# Patient Record
Sex: Female | Born: 1950 | Race: Black or African American | Hispanic: No | Marital: Married | Smoking: Never smoker
Health system: Southern US, Community
[De-identification: ages and names within clinical notes are randomized; demographics above are authoritative.]

## PROBLEM LIST (undated history)

## (undated) DIAGNOSIS — R51 Headache: Secondary | ICD-10-CM

## (undated) DIAGNOSIS — M1990 Unspecified osteoarthritis, unspecified site: Secondary | ICD-10-CM

## (undated) DIAGNOSIS — K5730 Diverticulosis of large intestine without perforation or abscess without bleeding: Secondary | ICD-10-CM

## (undated) DIAGNOSIS — G4730 Sleep apnea, unspecified: Secondary | ICD-10-CM

## (undated) DIAGNOSIS — I10 Essential (primary) hypertension: Secondary | ICD-10-CM

## (undated) DIAGNOSIS — C539 Malignant neoplasm of cervix uteri, unspecified: Secondary | ICD-10-CM

## (undated) HISTORY — DX: Sleep apnea, unspecified: G47.30

## (undated) HISTORY — PX: ABDOMINAL HYSTERECTOMY: SHX81

## (undated) HISTORY — DX: Headache: R51

## (undated) HISTORY — DX: Diverticulosis of large intestine without perforation or abscess without bleeding: K57.30

## (undated) HISTORY — PX: OTHER SURGICAL HISTORY: SHX169

## (undated) HISTORY — PX: CHOLECYSTECTOMY: SHX55

## (undated) HISTORY — DX: Essential (primary) hypertension: I10

## (undated) HISTORY — DX: Unspecified osteoarthritis, unspecified site: M19.90

## (undated) HISTORY — PX: KNEE SURGERY: SHX244

---

## 1997-07-27 ENCOUNTER — Encounter: Payer: Self-pay | Admitting: Internal Medicine

## 1998-02-09 ENCOUNTER — Emergency Department (HOSPITAL_COMMUNITY): Admission: EM | Admit: 1998-02-09 | Discharge: 1998-02-09 | Payer: Self-pay | Admitting: Emergency Medicine

## 1998-09-01 ENCOUNTER — Ambulatory Visit (HOSPITAL_COMMUNITY): Admission: RE | Admit: 1998-09-01 | Discharge: 1998-09-01 | Payer: Self-pay | Admitting: Internal Medicine

## 1998-09-01 ENCOUNTER — Encounter: Payer: Self-pay | Admitting: Internal Medicine

## 1998-10-11 ENCOUNTER — Other Ambulatory Visit: Admission: RE | Admit: 1998-10-11 | Discharge: 1998-10-11 | Payer: Self-pay | Admitting: Internal Medicine

## 2001-09-23 ENCOUNTER — Other Ambulatory Visit: Admission: RE | Admit: 2001-09-23 | Discharge: 2001-09-23 | Payer: Self-pay | Admitting: Internal Medicine

## 2002-09-09 ENCOUNTER — Encounter (HOSPITAL_BASED_OUTPATIENT_CLINIC_OR_DEPARTMENT_OTHER): Payer: Self-pay | Admitting: General Surgery

## 2002-09-13 ENCOUNTER — Encounter (INDEPENDENT_AMBULATORY_CARE_PROVIDER_SITE_OTHER): Payer: Self-pay | Admitting: *Deleted

## 2002-09-13 ENCOUNTER — Ambulatory Visit (HOSPITAL_COMMUNITY): Admission: RE | Admit: 2002-09-13 | Discharge: 2002-09-13 | Payer: Self-pay | Admitting: General Surgery

## 2002-12-31 ENCOUNTER — Other Ambulatory Visit: Admission: RE | Admit: 2002-12-31 | Discharge: 2002-12-31 | Payer: Self-pay | Admitting: Internal Medicine

## 2004-05-16 ENCOUNTER — Ambulatory Visit: Payer: Self-pay | Admitting: Internal Medicine

## 2004-07-02 ENCOUNTER — Ambulatory Visit: Payer: Self-pay | Admitting: Internal Medicine

## 2004-07-04 ENCOUNTER — Ambulatory Visit: Payer: Self-pay

## 2004-08-06 ENCOUNTER — Encounter: Admission: RE | Admit: 2004-08-06 | Discharge: 2004-08-06 | Payer: Self-pay | Admitting: Internal Medicine

## 2004-08-06 ENCOUNTER — Ambulatory Visit: Payer: Self-pay | Admitting: Internal Medicine

## 2004-08-14 ENCOUNTER — Encounter: Admission: RE | Admit: 2004-08-14 | Discharge: 2004-08-14 | Payer: Self-pay | Admitting: Internal Medicine

## 2004-08-30 ENCOUNTER — Ambulatory Visit: Payer: Self-pay | Admitting: Family Medicine

## 2004-09-07 ENCOUNTER — Encounter (INDEPENDENT_AMBULATORY_CARE_PROVIDER_SITE_OTHER): Payer: Self-pay | Admitting: Specialist

## 2004-09-07 ENCOUNTER — Observation Stay (HOSPITAL_COMMUNITY): Admission: RE | Admit: 2004-09-07 | Discharge: 2004-09-08 | Payer: Self-pay | Admitting: General Surgery

## 2004-09-12 ENCOUNTER — Ambulatory Visit: Payer: Self-pay | Admitting: Internal Medicine

## 2005-02-15 ENCOUNTER — Ambulatory Visit: Payer: Self-pay | Admitting: Internal Medicine

## 2005-02-21 ENCOUNTER — Encounter: Admission: RE | Admit: 2005-02-21 | Discharge: 2005-02-21 | Payer: Self-pay | Admitting: Internal Medicine

## 2005-03-14 ENCOUNTER — Ambulatory Visit: Payer: Self-pay | Admitting: Internal Medicine

## 2005-05-06 ENCOUNTER — Ambulatory Visit (HOSPITAL_COMMUNITY): Admission: RE | Admit: 2005-05-06 | Discharge: 2005-05-06 | Payer: Self-pay | Admitting: *Deleted

## 2005-05-06 ENCOUNTER — Encounter (INDEPENDENT_AMBULATORY_CARE_PROVIDER_SITE_OTHER): Payer: Self-pay | Admitting: Specialist

## 2005-05-21 ENCOUNTER — Ambulatory Visit: Payer: Self-pay | Admitting: Internal Medicine

## 2005-07-29 ENCOUNTER — Ambulatory Visit: Payer: Self-pay | Admitting: Internal Medicine

## 2005-08-20 ENCOUNTER — Ambulatory Visit: Payer: Self-pay | Admitting: Internal Medicine

## 2005-08-21 ENCOUNTER — Ambulatory Visit (HOSPITAL_BASED_OUTPATIENT_CLINIC_OR_DEPARTMENT_OTHER): Admission: RE | Admit: 2005-08-21 | Discharge: 2005-08-21 | Payer: Self-pay | Admitting: Internal Medicine

## 2005-08-21 ENCOUNTER — Encounter: Payer: Self-pay | Admitting: Internal Medicine

## 2005-08-25 ENCOUNTER — Ambulatory Visit: Payer: Self-pay | Admitting: Internal Medicine

## 2005-09-05 ENCOUNTER — Ambulatory Visit: Payer: Self-pay | Admitting: Internal Medicine

## 2005-09-10 ENCOUNTER — Ambulatory Visit: Payer: Self-pay | Admitting: Internal Medicine

## 2005-10-03 ENCOUNTER — Ambulatory Visit: Payer: Self-pay | Admitting: Internal Medicine

## 2005-11-14 ENCOUNTER — Ambulatory Visit: Payer: Self-pay | Admitting: Internal Medicine

## 2006-02-12 ENCOUNTER — Ambulatory Visit: Payer: Self-pay | Admitting: Internal Medicine

## 2007-05-05 ENCOUNTER — Ambulatory Visit: Payer: Self-pay | Admitting: Internal Medicine

## 2007-05-05 DIAGNOSIS — M479 Spondylosis, unspecified: Secondary | ICD-10-CM | POA: Insufficient documentation

## 2007-05-05 DIAGNOSIS — M1990 Unspecified osteoarthritis, unspecified site: Secondary | ICD-10-CM | POA: Insufficient documentation

## 2007-05-05 DIAGNOSIS — G4730 Sleep apnea, unspecified: Secondary | ICD-10-CM | POA: Insufficient documentation

## 2007-05-05 DIAGNOSIS — M129 Arthropathy, unspecified: Secondary | ICD-10-CM | POA: Insufficient documentation

## 2007-05-05 DIAGNOSIS — I10 Essential (primary) hypertension: Secondary | ICD-10-CM | POA: Insufficient documentation

## 2007-05-05 DIAGNOSIS — M25549 Pain in joints of unspecified hand: Secondary | ICD-10-CM

## 2007-05-05 DIAGNOSIS — G2581 Restless legs syndrome: Secondary | ICD-10-CM

## 2007-05-05 DIAGNOSIS — D259 Leiomyoma of uterus, unspecified: Secondary | ICD-10-CM | POA: Insufficient documentation

## 2007-05-28 ENCOUNTER — Ambulatory Visit: Payer: Self-pay | Admitting: Internal Medicine

## 2007-05-28 LAB — CONVERTED CEMR LAB
Alkaline Phosphatase: 61 units/L (ref 39–117)
BUN: 15 mg/dL (ref 6–23)
Basophils Absolute: 0 10*3/uL (ref 0.0–0.1)
CO2: 32 meq/L (ref 19–32)
CRP, High Sensitivity: 12 — ABNORMAL HIGH (ref 0.00–5.00)
Cholesterol: 232 mg/dL (ref 0–200)
Creatinine, Ser: 0.8 mg/dL (ref 0.4–1.2)
Direct LDL: 139 mg/dL
Eosinophils Absolute: 0.1 10*3/uL (ref 0.0–0.6)
GFR calc Af Amer: 95 mL/min
HCT: 38.3 % (ref 36.0–46.0)
HDL: 77 mg/dL (ref >39.0)
Hemoglobin: 13.3 g/dL (ref 12.0–15.0)
Lymphocytes Relative: 38.5 % (ref 12.0–46.0)
MCHC: 34.7 g/dL (ref 30.0–36.0)
MCV: 87.3 fL (ref 78.0–100.0)
Monocytes Absolute: 0.4 10*3/uL (ref 0.2–0.7)
Monocytes Relative: 6.6 % (ref 3.0–11.0)
Neutro Abs: 2.9 10*3/uL (ref 1.4–7.7)
Neutrophils Relative %: 53.6 % (ref 43.0–77.0)
Potassium: 4.2 meq/L (ref 3.5–5.1)
Rhuematoid fact SerPl-aCnc: <20 intl units/mL — ABNORMAL LOW (ref 0.0–20.0)
Sodium: 140 meq/L (ref 135–145)
TSH: 2.02 microintl units/mL (ref 0.35–5.50)
Total Bilirubin: 0.8 mg/dL (ref 0.3–1.2)
Total CHOL/HDL Ratio: 3
Triglycerides: 73 mg/dL (ref 0–149)
Uric Acid, Serum: 5.8 mg/dL (ref 2.4–7.0)
VLDL: 15 mg/dL (ref 0–40)

## 2007-05-31 LAB — CONVERTED CEMR LAB
Anti Nuclear Antibody(ANA): NEGATIVE
Vit D, 1,25-Dihydroxy: 10 — ABNORMAL LOW (ref 30–89)

## 2007-06-03 ENCOUNTER — Ambulatory Visit: Payer: Self-pay | Admitting: Internal Medicine

## 2007-06-03 DIAGNOSIS — E559 Vitamin D deficiency, unspecified: Secondary | ICD-10-CM | POA: Insufficient documentation

## 2007-06-22 ENCOUNTER — Encounter: Payer: Self-pay | Admitting: Internal Medicine

## 2007-07-21 ENCOUNTER — Ambulatory Visit: Payer: Self-pay | Admitting: Internal Medicine

## 2007-07-21 DIAGNOSIS — R05 Cough: Secondary | ICD-10-CM | POA: Insufficient documentation

## 2007-08-05 ENCOUNTER — Ambulatory Visit: Payer: Self-pay | Admitting: Internal Medicine

## 2007-08-05 LAB — CONVERTED CEMR LAB: CRP, High Sensitivity: 9 — ABNORMAL HIGH (ref 0.00–5.00)

## 2007-08-07 ENCOUNTER — Encounter: Payer: Self-pay | Admitting: Internal Medicine

## 2007-08-09 LAB — CONVERTED CEMR LAB: Vit D, 1,25-Dihydroxy: 31 (ref 30–89)

## 2007-08-14 ENCOUNTER — Ambulatory Visit: Payer: Self-pay | Admitting: Internal Medicine

## 2007-08-14 DIAGNOSIS — K5730 Diverticulosis of large intestine without perforation or abscess without bleeding: Secondary | ICD-10-CM | POA: Insufficient documentation

## 2007-08-18 ENCOUNTER — Encounter: Payer: Self-pay | Admitting: Internal Medicine

## 2007-09-11 ENCOUNTER — Ambulatory Visit: Payer: Self-pay | Admitting: Internal Medicine

## 2007-09-17 ENCOUNTER — Ambulatory Visit: Payer: Self-pay | Admitting: Family Medicine

## 2007-09-17 DIAGNOSIS — G43909 Migraine, unspecified, not intractable, without status migrainosus: Secondary | ICD-10-CM | POA: Insufficient documentation

## 2007-09-22 ENCOUNTER — Ambulatory Visit: Payer: Self-pay | Admitting: Internal Medicine

## 2007-09-22 ENCOUNTER — Telehealth (INDEPENDENT_AMBULATORY_CARE_PROVIDER_SITE_OTHER): Payer: Self-pay | Admitting: *Deleted

## 2007-09-22 DIAGNOSIS — R519 Headache, unspecified: Secondary | ICD-10-CM | POA: Insufficient documentation

## 2007-09-22 DIAGNOSIS — R51 Headache: Secondary | ICD-10-CM

## 2007-09-24 ENCOUNTER — Telehealth: Payer: Self-pay | Admitting: Family Medicine

## 2007-09-28 ENCOUNTER — Encounter: Admission: RE | Admit: 2007-09-28 | Discharge: 2007-09-28 | Payer: Self-pay | Admitting: Internal Medicine

## 2007-09-28 LAB — CONVERTED CEMR LAB: BUN: 12 mg/dL (ref 6–23)

## 2007-09-30 DIAGNOSIS — R9409 Abnormal results of other function studies of central nervous system: Secondary | ICD-10-CM

## 2007-10-06 ENCOUNTER — Ambulatory Visit: Payer: Self-pay | Admitting: Gastroenterology

## 2007-10-16 ENCOUNTER — Ambulatory Visit: Payer: Self-pay | Admitting: Internal Medicine

## 2007-10-16 DIAGNOSIS — L659 Nonscarring hair loss, unspecified: Secondary | ICD-10-CM | POA: Insufficient documentation

## 2007-10-16 DIAGNOSIS — R7309 Other abnormal glucose: Secondary | ICD-10-CM

## 2007-10-16 LAB — CONVERTED CEMR LAB: Blood Glucose, Fingerstick: 90

## 2007-10-20 ENCOUNTER — Encounter: Payer: Self-pay | Admitting: Internal Medicine

## 2007-10-20 ENCOUNTER — Ambulatory Visit: Payer: Self-pay | Admitting: Gastroenterology

## 2007-10-23 ENCOUNTER — Telehealth: Payer: Self-pay | Admitting: *Deleted

## 2007-10-29 ENCOUNTER — Telehealth: Payer: Self-pay | Admitting: Internal Medicine

## 2007-11-03 ENCOUNTER — Encounter: Payer: Self-pay | Admitting: Internal Medicine

## 2008-02-12 ENCOUNTER — Ambulatory Visit: Payer: Self-pay | Admitting: Internal Medicine

## 2008-02-12 DIAGNOSIS — M25519 Pain in unspecified shoulder: Secondary | ICD-10-CM | POA: Insufficient documentation

## 2008-02-12 LAB — CONVERTED CEMR LAB: Blood Glucose, Fingerstick: 105

## 2008-02-24 ENCOUNTER — Encounter: Payer: Self-pay | Admitting: Internal Medicine

## 2008-04-07 ENCOUNTER — Encounter: Payer: Self-pay | Admitting: Internal Medicine

## 2008-04-18 ENCOUNTER — Ambulatory Visit: Payer: Self-pay | Admitting: Internal Medicine

## 2008-04-18 DIAGNOSIS — N926 Irregular menstruation, unspecified: Secondary | ICD-10-CM | POA: Insufficient documentation

## 2008-04-18 DIAGNOSIS — D649 Anemia, unspecified: Secondary | ICD-10-CM | POA: Insufficient documentation

## 2008-04-18 DIAGNOSIS — N939 Abnormal uterine and vaginal bleeding, unspecified: Secondary | ICD-10-CM

## 2008-04-18 LAB — CONVERTED CEMR LAB: Hemoglobin: 11.6 g/dL

## 2008-07-13 ENCOUNTER — Ambulatory Visit: Payer: Self-pay | Admitting: Internal Medicine

## 2008-07-27 ENCOUNTER — Encounter: Payer: Self-pay | Admitting: Internal Medicine

## 2008-08-04 ENCOUNTER — Ambulatory Visit: Payer: Self-pay | Admitting: Internal Medicine

## 2008-08-04 LAB — CONVERTED CEMR LAB
ALT: 17 units/L (ref 0–35)
Alkaline Phosphatase: 61 units/L (ref 39–117)
Basophils Relative: 0.7 % (ref 0.0–3.0)
Bilirubin, Direct: 0.1 mg/dL (ref 0.0–0.3)
CO2: 29 meq/L (ref 19–32)
Eosinophils Relative: 0.6 % (ref 0.0–5.0)
Glucose, Bld: 100 mg/dL — ABNORMAL HIGH (ref 70–99)
HDL: 72.3 mg/dL (ref >39.0)
Hemoglobin: 12.7 g/dL (ref 12.0–15.0)
Lymphocytes Relative: 33.2 % (ref 12.0–46.0)
MCHC: 34.5 g/dL (ref 30.0–36.0)
Neutrophils Relative %: 59.4 % (ref 43.0–77.0)
Potassium: 3.2 meq/L — ABNORMAL LOW (ref 3.5–5.1)
RBC: 4.24 M/uL (ref 3.87–5.11)
Sodium: 140 meq/L (ref 135–145)
TSH: 1.31 microintl units/mL (ref 0.35–5.50)
Total CHOL/HDL Ratio: 3
Total Protein: 6.9 g/dL (ref 6.0–8.3)
VLDL: 18 mg/dL (ref 0–40)
WBC: 5.6 10*3/uL (ref 4.5–10.5)

## 2008-08-15 ENCOUNTER — Ambulatory Visit: Payer: Self-pay | Admitting: Internal Medicine

## 2008-08-19 ENCOUNTER — Ambulatory Visit: Payer: Self-pay | Admitting: Internal Medicine

## 2008-09-15 ENCOUNTER — Ambulatory Visit: Payer: Self-pay | Admitting: Internal Medicine

## 2008-09-15 LAB — CONVERTED CEMR LAB
CO2: 29 meq/L (ref 19–32)
Potassium: 3.9 meq/L (ref 3.5–5.1)
Sodium: 143 meq/L (ref 135–145)

## 2008-09-23 ENCOUNTER — Ambulatory Visit: Payer: Self-pay | Admitting: Internal Medicine

## 2008-09-23 DIAGNOSIS — N760 Acute vaginitis: Secondary | ICD-10-CM | POA: Insufficient documentation

## 2008-09-23 DIAGNOSIS — M25569 Pain in unspecified knee: Secondary | ICD-10-CM

## 2008-10-12 ENCOUNTER — Encounter: Payer: Self-pay | Admitting: Internal Medicine

## 2008-10-25 ENCOUNTER — Telehealth: Payer: Self-pay | Admitting: Internal Medicine

## 2008-10-28 ENCOUNTER — Encounter: Payer: Self-pay | Admitting: Internal Medicine

## 2008-11-09 ENCOUNTER — Telehealth: Payer: Self-pay | Admitting: *Deleted

## 2008-11-25 ENCOUNTER — Ambulatory Visit (HOSPITAL_COMMUNITY): Admission: RE | Admit: 2008-11-25 | Discharge: 2008-11-26 | Payer: Self-pay | Admitting: Orthopedic Surgery

## 2009-03-07 ENCOUNTER — Ambulatory Visit: Payer: Self-pay | Admitting: Internal Medicine

## 2009-03-07 DIAGNOSIS — L609 Nail disorder, unspecified: Secondary | ICD-10-CM | POA: Insufficient documentation

## 2009-03-07 DIAGNOSIS — K5900 Constipation, unspecified: Secondary | ICD-10-CM | POA: Insufficient documentation

## 2009-03-28 ENCOUNTER — Ambulatory Visit: Payer: Self-pay | Admitting: Internal Medicine

## 2009-04-12 ENCOUNTER — Encounter (INDEPENDENT_AMBULATORY_CARE_PROVIDER_SITE_OTHER): Payer: Self-pay | Admitting: *Deleted

## 2009-04-14 IMAGING — CR DG SHOULDER 2+V*R*
3 series · 3 of 3 positions shown · non-contrast
Comparison: No priors

CLINICAL DATA: Right shoulder pain - no known injury

RIGHT SHOULDER - 2+ VIEW

[view not recorded (1 of 3)]
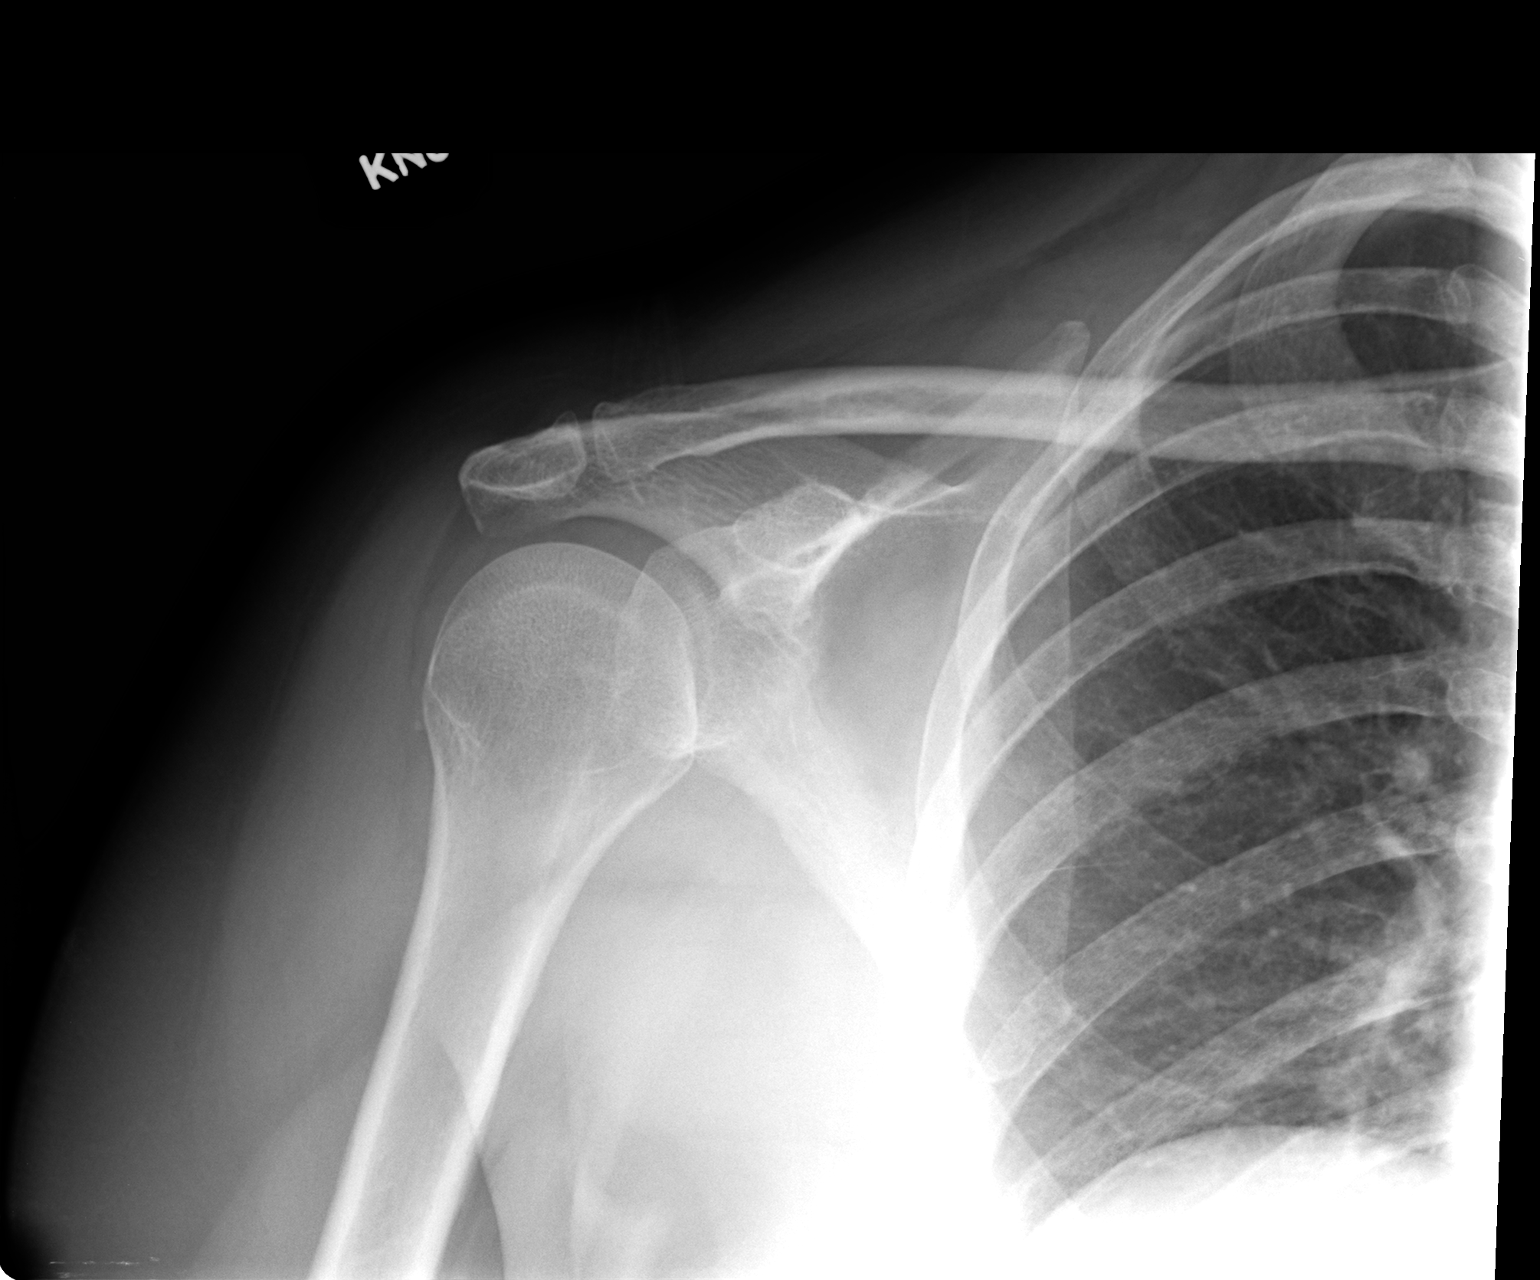

[view not recorded (2 of 3)]
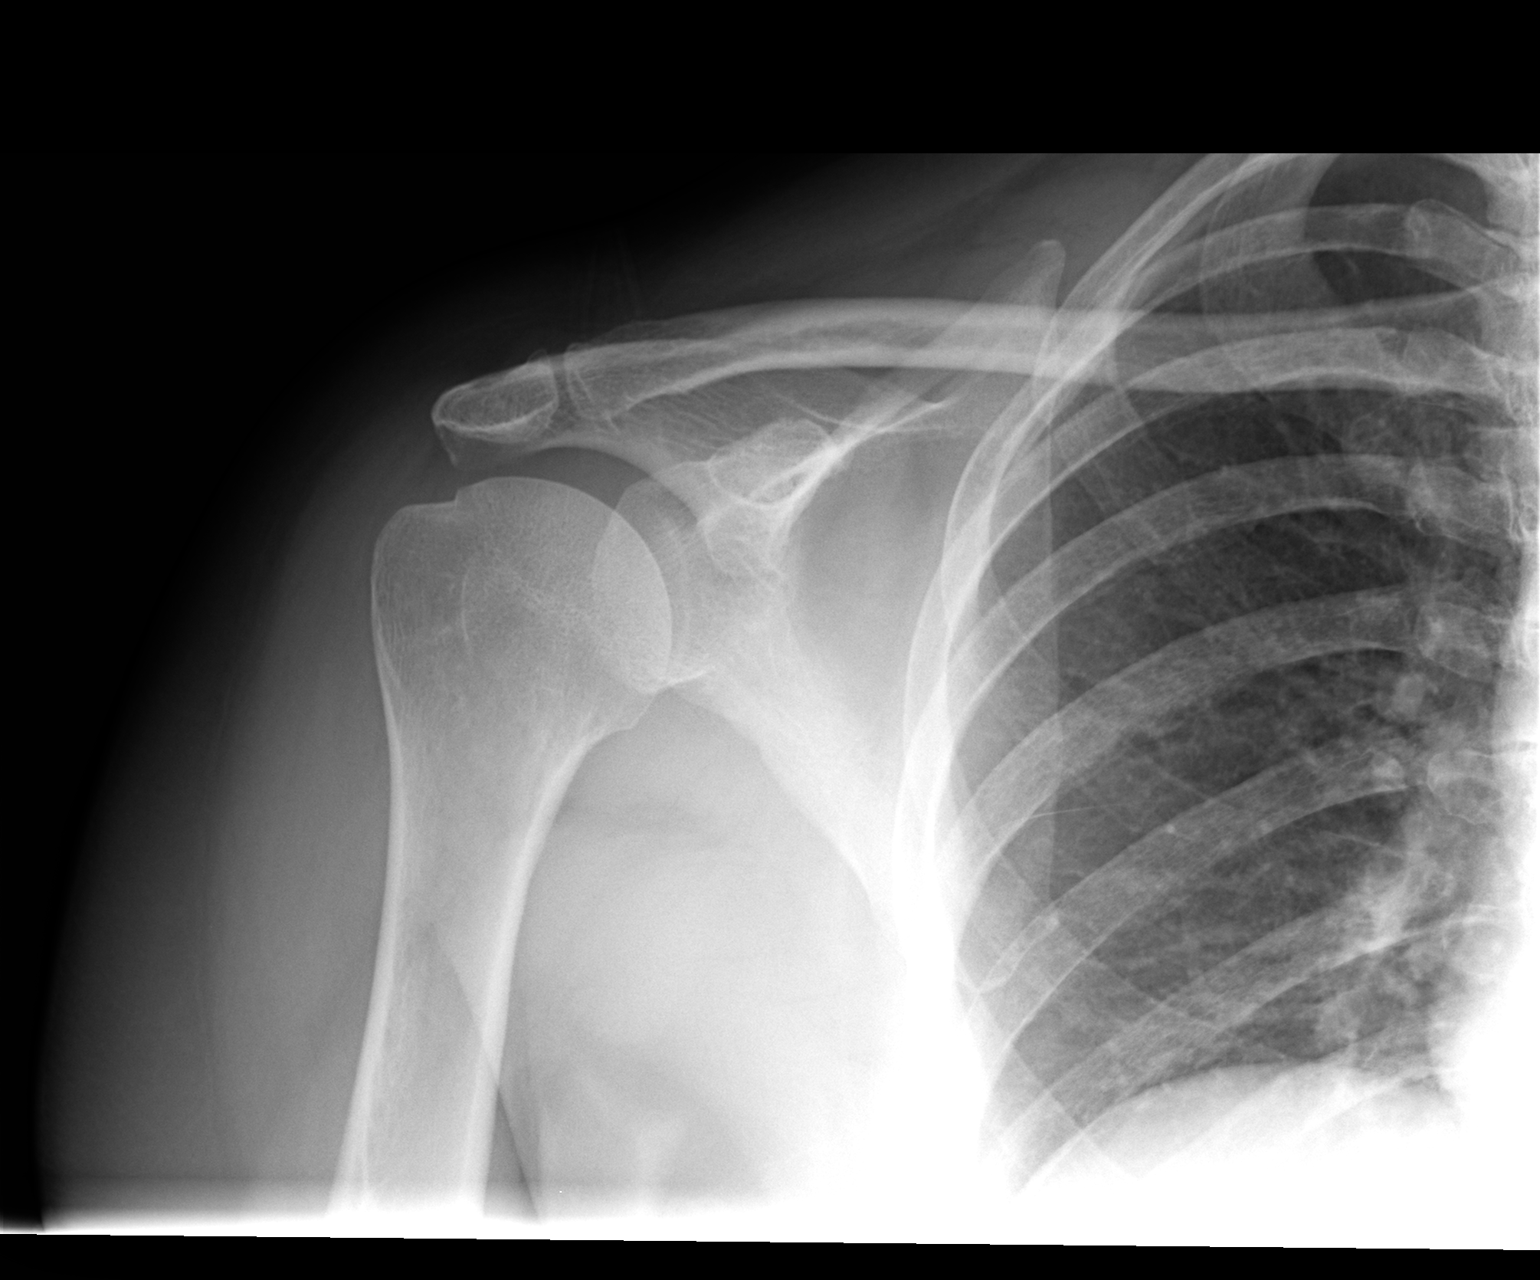

[view not recorded (3 of 3)]
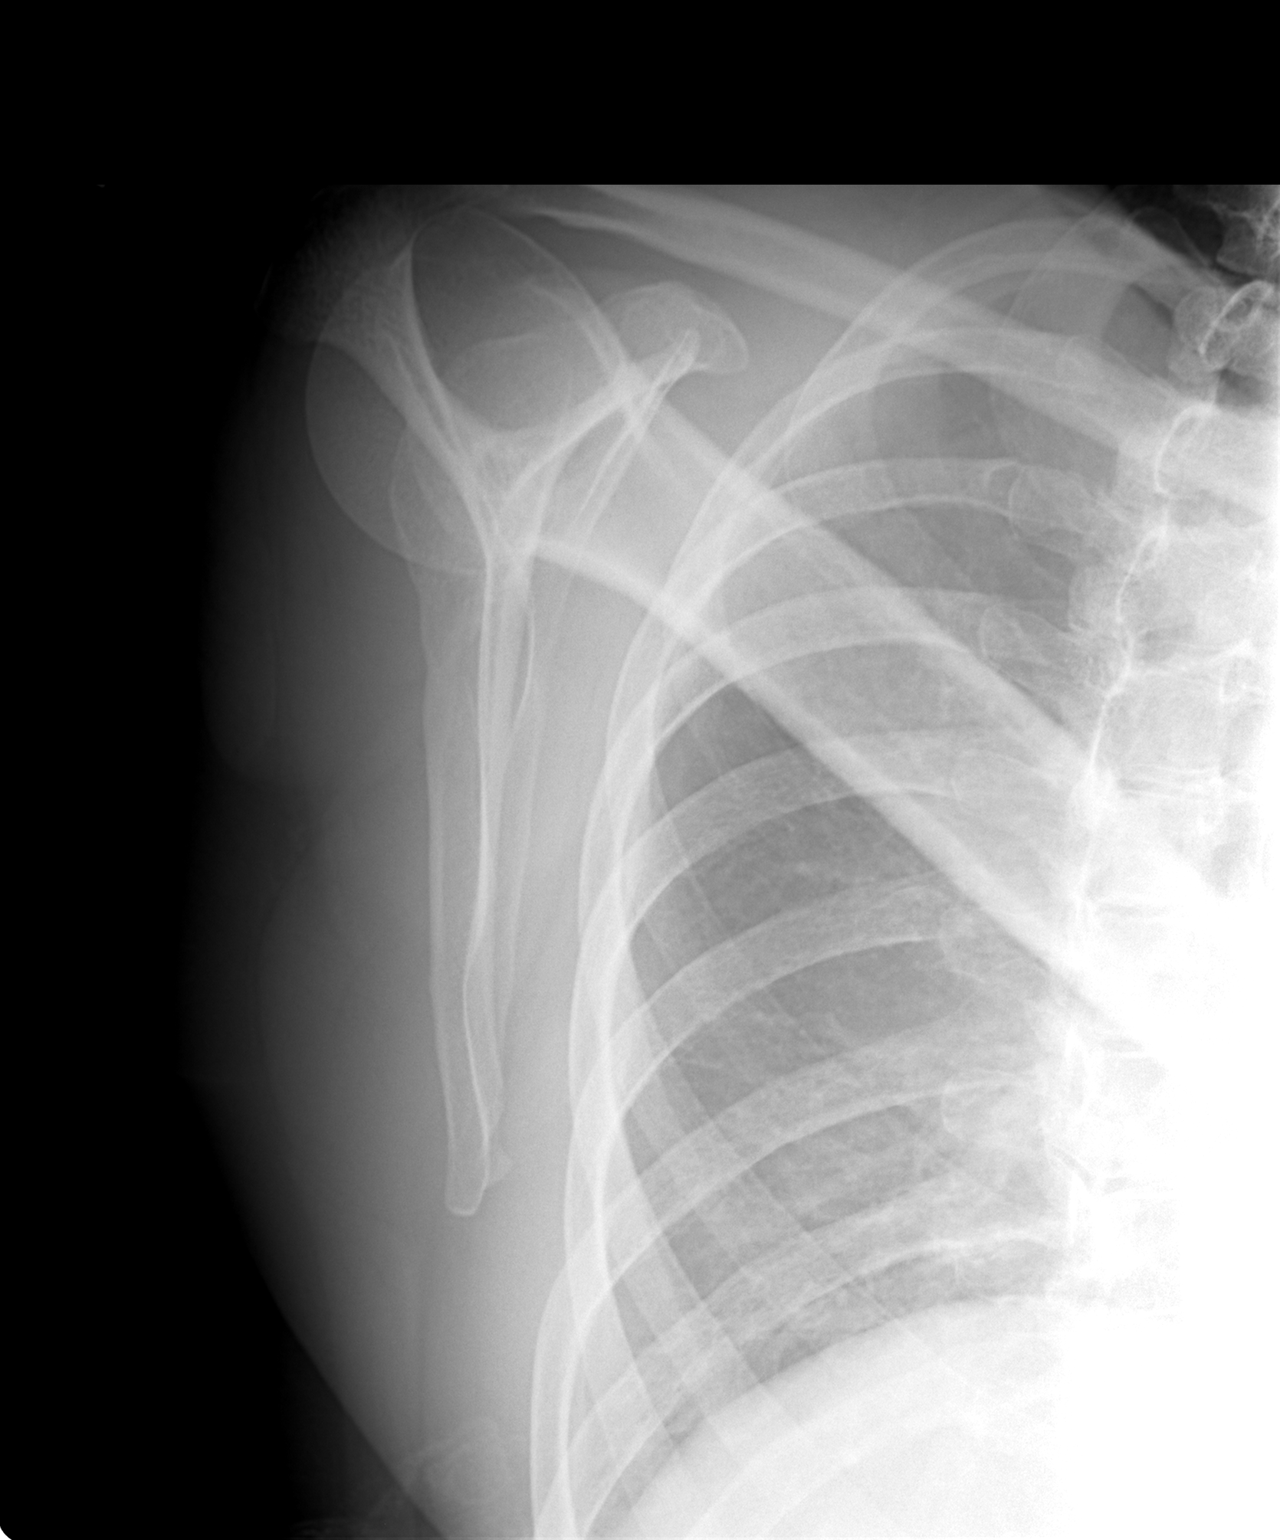

[3 of 3 positions shown; findings below may reference images not displayed]

FINDINGS: Mild degenerative changes of the AC joint.  No fracture,
dislocation, or soft tissue calcifications.
IMPRESSION: Mild degenerative changes - no acute findings.

## 2009-04-20 ENCOUNTER — Encounter: Payer: Self-pay | Admitting: Internal Medicine

## 2009-04-26 ENCOUNTER — Ambulatory Visit: Payer: Self-pay | Admitting: Internal Medicine

## 2009-05-01 ENCOUNTER — Telehealth: Payer: Self-pay | Admitting: Internal Medicine

## 2009-05-03 ENCOUNTER — Ambulatory Visit: Payer: Self-pay | Admitting: Internal Medicine

## 2009-05-03 DIAGNOSIS — R209 Unspecified disturbances of skin sensation: Secondary | ICD-10-CM | POA: Insufficient documentation

## 2009-05-05 ENCOUNTER — Encounter (INDEPENDENT_AMBULATORY_CARE_PROVIDER_SITE_OTHER): Payer: Self-pay | Admitting: *Deleted

## 2009-08-10 ENCOUNTER — Ambulatory Visit: Payer: Self-pay | Admitting: Internal Medicine

## 2009-08-18 ENCOUNTER — Telehealth: Payer: Self-pay | Admitting: *Deleted

## 2009-08-27 ENCOUNTER — Encounter: Payer: Self-pay | Admitting: Internal Medicine

## 2009-08-29 ENCOUNTER — Telehealth (INDEPENDENT_AMBULATORY_CARE_PROVIDER_SITE_OTHER): Payer: Self-pay | Admitting: *Deleted

## 2009-11-11 ENCOUNTER — Encounter: Payer: Self-pay | Admitting: Internal Medicine

## 2009-11-17 ENCOUNTER — Ambulatory Visit: Payer: Self-pay | Admitting: Internal Medicine

## 2010-04-02 ENCOUNTER — Ambulatory Visit: Payer: Self-pay | Admitting: Internal Medicine

## 2010-04-02 DIAGNOSIS — M722 Plantar fascial fibromatosis: Secondary | ICD-10-CM

## 2010-04-10 LAB — CONVERTED CEMR LAB
ALT: 18 units/L (ref 0–35)
BUN: 10 mg/dL (ref 6–23)
Basophils Absolute: 0 10*3/uL (ref 0.0–0.1)
Basophils Relative: 0.4 % (ref 0.0–3.0)
Bilirubin, Direct: 0.1 mg/dL (ref 0.0–0.3)
Chloride: 104 meq/L (ref 96–112)
GFR calc non Af Amer: 117.32 mL/min (ref >60)
HDL: 65.4 mg/dL (ref >39.00)
Hemoglobin: 12.4 g/dL (ref 12.0–15.0)
Lymphocytes Relative: 36 % (ref 12.0–46.0)
MCHC: 33.9 g/dL (ref 30.0–36.0)
MCV: 87.7 fL (ref 78.0–100.0)
Magnesium: 1.9 mg/dL (ref 1.5–2.5)
Monocytes Relative: 4.9 % (ref 3.0–12.0)
Neutro Abs: 2.9 10*3/uL (ref 1.4–7.7)
Neutrophils Relative %: 58.2 % (ref 43.0–77.0)
Potassium: 4.1 meq/L (ref 3.5–5.1)
RBC: 4.17 M/uL (ref 3.87–5.11)
Sodium: 140 meq/L (ref 135–145)
Total Bilirubin: 0.5 mg/dL (ref 0.3–1.2)
Total CHOL/HDL Ratio: 3
VLDL: 27.8 mg/dL (ref 0.0–40.0)
WBC: 5.1 10*3/uL (ref 4.5–10.5)

## 2010-04-13 ENCOUNTER — Telehealth: Payer: Self-pay | Admitting: *Deleted

## 2010-05-17 ENCOUNTER — Encounter: Payer: Self-pay | Admitting: Internal Medicine

## 2010-05-29 ENCOUNTER — Telehealth: Payer: Self-pay | Admitting: Internal Medicine

## 2010-05-29 ENCOUNTER — Encounter: Payer: Self-pay | Admitting: Internal Medicine

## 2010-07-07 ENCOUNTER — Encounter: Payer: Self-pay | Admitting: Internal Medicine

## 2010-07-15 LAB — CONVERTED CEMR LAB
ALT: 16 units/L (ref 0–35)
Albumin: 3.9 g/dL (ref 3.5–5.2)
Anti Nuclear Antibody(ANA): NEGATIVE
BUN: 15 mg/dL (ref 6–23)
Basophils Absolute: 0 10*3/uL (ref 0.0–0.1)
CRP, High Sensitivity: 12.2 — ABNORMAL HIGH (ref 0.00–5.00)
Calcium: 9.1 mg/dL (ref 8.4–10.5)
Creatinine, Ser: 0.7 mg/dL (ref 0.4–1.2)
Eosinophils Absolute: 0 10*3/uL (ref 0.0–0.7)
GFR calc non Af Amer: 110.02 mL/min (ref >60)
Glucose, Bld: 94 mg/dL (ref 70–99)
Lymphocytes Relative: 39.1 % (ref 12.0–46.0)
MCHC: 34 g/dL (ref 30.0–36.0)
Monocytes Relative: 5.1 % (ref 3.0–12.0)
Neutrophils Relative %: 54.6 % (ref 43.0–77.0)
Potassium: 3.4 meq/L — ABNORMAL LOW (ref 3.5–5.1)
RDW: 12.8 % (ref 11.5–14.6)
Rhuematoid fact SerPl-aCnc: 31.5 intl units/mL — ABNORMAL HIGH (ref 0.0–20.0)
Total Bilirubin: 0.7 mg/dL (ref 0.3–1.2)
Total Protein: 7.6 g/dL (ref 6.0–8.3)
Vit D, 25-Hydroxy: 21 ng/mL — ABNORMAL LOW (ref 30–89)
Vitamin B-12: 198 pg/mL — ABNORMAL LOW (ref 211–911)

## 2010-07-17 NOTE — Progress Notes (Signed)
Summary: REQ FOR REFERRAL  Phone Note Call from Patient   Caller: Patient  [phone] Summary of Call: Pt would like to have a referral to a specialist due to continued R shoulder pain, tingling sensation going down arm into fingers...Marland KitchenMarland Kitchen Pt adv that she came in for OV last week and discussed same with Dr Fabian Sharp....... Pt can be reached at [phone].  Initial call taken by: Debbra Riding,  April 13, 2010 3:15 PM  Follow-up for Phone Call        please do ortho referral  for shoulder arm pain and tingling Follow-up by: Madelin Headings MD,  April 13, 2010 3:46 PM  Additional Follow-up for Phone Call Additional follow up Details #1::        Order sent to [facility] and pt aware. Additional Follow-up by: Romualdo Bolk, CMA Duncan Dull),  April 13, 2010 3:48 PM

## 2010-07-17 NOTE — Progress Notes (Signed)
Summary: supplies  Phone Note From Other Clinic Call back at [phone] ex [phone]   Caller: Patient Caller: [facility]) Call For: young Summary of Call: calling about prescript that was faxed for c pap supplies Initial call taken by: Rickard Patience,  August 29, 2009 10:03 AM  Follow-up for Phone Call        Katie, do you have any paperwork from [facility] on this pt?  Aundra Millet Reynolds LPN  August 29, 2009 10:12 AM   Additional Follow-up for Phone Call Additional follow up Details #1::        I don't find any. Additional Follow-up by: Waymon Budge MD,  August 29, 2009 1:29 PM    Additional Follow-up for Phone Call Additional follow up Details #2::    LM with Tresa Endo from [facility] requesting she refax prescript for cpap supplies .  Aundra Millet Reynolds LPN  August 29, 2009 2:04 PM

## 2010-07-17 NOTE — Assessment & Plan Note (Signed)
Summary: ROV ///KP   Primary Provider/Referring Provider:  Fabian Sharp  CC:  Yearly follow up visit-needs nose gaurd..  History of Present Illness: 09/11/07 Autotitration download suggested 10.8 cwo but with a residual AHI of 11.8. We are going to ask [facility] to increase her cpap to 12. She dislikes her current mask which is uncomfortable.  08/19/08-OSA Waking more with left frontal headaches "migraine" without scintillation or paresthesias.  CPAP has been set at 11 through the past year. Husband sometimes hears some snore.Feeling a little pressure in her ear, but not nasal congestion or discharge. Same full face mask style/ mask straps sometimes tight. Not aware of obvious sinus discomfort.  August 10, 2009- OSA last compliance download indicated good compliance with adequate control at 11 cwp, which is where her pressure is now set. She uses cpap every night "basically". Some issue of mask fit. Denies nasal congestion. Noticing more night sweats which she attributes to her mask. DME is [facility]". She is not waking often and denies significant daytime sleepiness.     Current Medications (verified): 1)  Diovan Hct 160-12.5 Mg  Tabs (Valsartan-Hydrochlorothiazide) .Marland Kitchen.. 1 By Mouth Once Daily 2)  Cpap .... Set At 11 3)  Klor-Con M20 20 Meq Cr-Tabs (Potassium Chloride Crys Cr) .Marland Kitchen.. 1 By Mouth Once Daily 4)  Topiramate 50 Mg Tabs (Topiramate) .... 1.5  By Mouth Two Times A Day or As Directed 5)  Vitamin D (Ergocalciferol) 50000 Unit Caps (Ergocalciferol) .Marland Kitchen.. 1 By Mouth Weekly 6)  Aleve 220 Mg Tabs (Naproxen Sodium) 7)  Meloxicam 7.5 Mg Tabs (Meloxicam) .Marland Kitchen.. 1 By Mouth Two Times A Day or As Directed  Allergies (verified): 1)  ! Ace Inhibitors 2)  ! Codeine  Past History:  Past Medical History: Last updated: 07/13/2008 Hypertension Osteoarthritis Sleep Apnea Diverticulosis, colon childbirth Headache       CONSULTANTS  Neurology.    Past Surgical History: Last updated:  07/13/2008 Cholecystectomy Fibroids removed       Family History: Last updated: 07/13/2008 Family History of Colon CA 1st degree relative <60- Sister Family History Breast cancer 1st degree relative <50- Sister Family History of Alcoholism/Addiction- Father Family History of Cardiovascular disorder- Heart Murmur Family History of Hypertension- Father and Grandparents on both sides Family History Diabetes 1st degree relative- Grandmother on Father's side Father ? murmur Mom 70s    pacer sister recently  had ha eval and had an aneurysm repair.      No hx bleeding disorders  Social History: Last updated: 08/19/2008 Occupation: Geographical information systems officer Married- husband also uses cpap Never Smoked   Alcohol use-yes Drug use-no Regular exercise-yes     Risk Factors: Alcohol Use: 0 (05/03/2009) Caffeine Use: Less than 1 week (05/03/2009) Exercise: yes (05/03/2009)  Risk Factors: Smoking Status: never (05/03/2009)  Review of Systems      See HPI  The patient denies anorexia, fever, weight loss, weight gain, vision loss, decreased hearing, hoarseness, chest pain, syncope, dyspnea on exertion, peripheral edema, prolonged cough, headaches, hemoptysis, and severe indigestion/heartburn.    Vital Signs:  Patient profile:   60 year old female Menstrual status:  hysterectomy Height:      66 inches Weight:      241 pounds BMI:     39.04 O2 Sat:      97 % on Room air Pulse rate:   71 / minute BP sitting:   114 / 80  (left arm) Cuff size:   large  Vitals Entered By: Reynaldo Minium CMA (August 10, 2009 9:43  AM)  O2 Flow:  Room air  Physical Exam  Additional Exam:  General: A/Ox3; pleasant and cooperative, NAD, SKIN: no rash, lesions NODES: no lymphadenopathy HEENT: [place]/AT, EOM- WNL, Conjuctivae- clear, PERRLA, Periorbital puffiness, TM-WNL, Nose- clear, Throat- clear and wnl, [place]  IV NECK: Supple w/ fair ROM, JVD- none, normal carotid impulses w/o bruits Thyroid- normal to  palpation CHEST: Clear to P&A HEART: RRR, no m/g/r heard ABDOMEN: Soft and nl; EAV:WUJW, nl pulses, no edema  NEURO: Grossly intact to observation      Impression & Recommendations:  Problem # 1:  SLEEP APNEA (ICD-780.57)  We need to check with her DME to make sure she is getting needed mask supplies. There is room to go up one step on her pressure, but that wouldn't be handled by a mail order supply company. We will have our [facility] work with her.  Medications Added to Medication List This Visit: 1)  Cpap 12 [facility], Also "[facility]"   Other Orders: Est. Patient Level III (11914) DME Referral (DME)  Patient Instructions: 1)  Schedule return in one year, earlier if needed 2)  See [facility] to discuss DME support, mask needs etc. I am asking [facility] to increase your pressure to 12. If you don't like it, let us know and we will get it set back to 11.

## 2010-07-17 NOTE — Letter (Signed)
Summary: CMN for CPAP Supplies/[facility]  CMN for CPAP Supplies/[facility]   Imported By: Sherian Rein 08/30/2009 13:44:51  _____________________________________________________________________  External Attachment:    Type:   Image     Comment:   External Document

## 2010-07-17 NOTE — Progress Notes (Signed)
Summary: Pt req name of the podiatrist she was referred to  Phone Note Call from Patient Call back at Home Phone [phone]   Caller: Patient Summary of Call: Pt req the name of the foot doctor, that Dr Fabian Sharp referred pt to for problems with toenails. Please call with info. Pt req name of doctor and location of practice.  Initial call taken by: Lucy Antigua,  August 18, 2009 1:25 PM  Follow-up for Phone Call        I spoke to and told her to call [facility]. Follow-up by: Romualdo Bolk, CMA (AAMA),  August 18, 2009 1:39 PM

## 2010-07-17 NOTE — Letter (Signed)
Summary: CMN/[facility]  CMN/[facility]   Imported By: Lester Mellott 05/25/2010 11:01:50  _____________________________________________________________________  External Attachment:    Type:   Image     Comment:   External Document

## 2010-07-17 NOTE — Letter (Signed)
Summary: CMN for CPAP Supplies/[facility]  CMN for CPAP Supplies/[facility]   Imported By: Sherian Rein 11/17/2009 08:52:55  _____________________________________________________________________  External Attachment:    Type:   Image     Comment:   External Document

## 2010-07-17 NOTE — Assessment & Plan Note (Signed)
Summary: consult about bone spur and blood in stool/cjr/pt rescd//ccm/...   Vital Signs:  Patient profile:   60 year old female Menstrual status:  hysterectomy Weight:      235 pounds Pulse rate:   66 / minute BP sitting:   120 / 80  (right arm) Cuff size:   large  Vitals Entered By: Romualdo Bolk, CMA (AAMA) (April 02, 2010 1:29 PM) CC: 1)Pt has a heel spur in rt heel. 2) Rt shoulder x 1 month worse at night. Pt states that on 10/16 she felt a numbness going down her arm into her hand. 3) Rectal Bleeding resolved. It was on the outside of stool. She only saw it twice 2 weeks ago and hasn't seen any since.   History of Present Illness: Elizabeth [name]  comes today. for above reasons  Heel problem on going tying conservative rx .    C/O  of  fingers right hand numb   most recently  ? no weakness and seems to be radiating from above . No injury and is right handed.   worse with some acitvities.   has an episode of  blood around stool poss related to hard stool  and no resolved .No abd pain diarrhea vomiting fever or weight loss.     Preventive Screening-Counseling & Management  Alcohol-Tobacco     Alcohol drinks/day: 0     Smoking Status: never  Caffeine-Diet-Exercise     Caffeine use/day: Less than 1 week     Does Patient Exercise: yes     Type of exercise: everything     Times/week: 1  Current Medications (verified): 1)  Diovan Hct 160-12.5 Mg  Tabs (Valsartan-Hydrochlorothiazide) .Marland Kitchen.. 1 By Mouth Once Daily 2)  Cpap 12 [facility], Also "[facility]" 3)  Klor-Con M20 20 Meq Cr-Tabs (Potassium Chloride Crys Cr) .Marland Kitchen.. 1 By Mouth Once Daily 4)  Topiramate 50 Mg Tabs (Topiramate) .... 1.5  By Mouth Two Times A Day or As Directed 5)  Aleve 220 Mg Tabs (Naproxen Sodium) 6)  Meloxicam 7.5 Mg Tabs (Meloxicam) .Marland Kitchen.. 1 By Mouth Two Times A Day or As Directed  Allergies (verified): 1)  ! Ace Inhibitors 2)  ! Codeine  Past History:  Past medical, surgical, family and social  histories (including risk factors) reviewed, and no changes noted (except as noted below).  Past Medical History: Reviewed history from 07/13/2008 and no changes required. Hypertension Osteoarthritis Sleep Apnea Diverticulosis, colon childbirth Headache       CONSULTANTS  Neurology.    Past Surgical History: Reviewed history from 07/13/2008 and no changes required. Cholecystectomy Fibroids removed       Past History:  Care Management: Gastroenterology: Arlyce Dice Pulmonary: Young Orthopedics: Norris Rheumatology: Corliss Skains  Family History: Reviewed history from 07/13/2008 and no changes required. Family History of Colon CA 1st degree relative <60- Sister Family History Breast cancer 1st degree relative <50- Sister Family History of Alcoholism/Addiction- Father Family History of Cardiovascular disorder- Heart Murmur Family History of Hypertension- Father and Grandparents on both sides Family History Diabetes 1st degree relative- Grandmother on Father's side Father ? murmur Mom 70s    pacer sister recently  had ha eval and had an aneurysm repair.      No hx bleeding disorders  Social History: Reviewed history from 08/19/2008 and no changes required. 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    Physical Exam  General:  Well-developed,well-nourished,in no acute distress; alert,appropriate and cooperative throughout examination Head:  normocephalic and atraumatic.   Eyes:  vision grossly intact, pupils equal, and pupils round.   Neck:  supple and no masses.   Lungs:  normal respiratory effort, no intercostal  retractions, no accessory muscle use, and normal breath sounds.   Heart:  normal rate, regular rhythm, no murmur, no gallop, and no lifts.   Abdomen:  Bowel sounds positive,abdomen soft and non-tender without masses, organomegaly or   noted. Msk:  no joint swelling and no joint warmth.  mild oa   grip ue 5/5   pain on rom UE     no atrophy  right heel  slightly tender  minimal bunions Pulses:  pulses intact without delay   Extremities:  no clubbing cyanosis or edema  Neurologic:  alert & oriented X3.   slight? dec sense  medial nerv districbution right  no atrophy gait  slightly stiff  Skin:  turgor normal and color normal.   Cervical Nodes:  No lymphadenopathy noted Psych:  Oriented X3, good eye contact, not anxious appearing, and not depressed appearing.     Impression & Recommendations:  Problem # 1:  PLANTAR FASCIITIS, RIGHT (ICD-728.71) Her updated medication list for this problem includes:  Expectant management and rx  .  ice stretch heel inserts and pod or otho if persistent or  progressive      Aleve 220 Mg Tabs (Naproxen sodium)    Meloxicam 7.5 Mg Tabs (Meloxicam) .Marland Kitchen... 1 by mouth two times a day or as directed  Problem # 2:  DISTURBANCE OF SKIN SENSATION (ICD-782.0) poss carpal tunnel   vs other    disc   interventions and weill refer if  persistent or  progressive   Orders: Specimen Handling (30865) TLB-TSH (Thyroid Stimulating Hormone) (84443-TSH) TLB-Hepatic/Liver Function Pnl (80076-HEPATIC) TLB-CBC Platelet - w/Differential (85025-CBCD) TLB-Magnesium (Mg) (83735-MG)  Problem # 3:  SHOULDER PAIN, RIGHT (ICD-719.41) ? if related   to above  Her updated medication list for this problem includes:    Aleve 220 Mg Tabs (Naproxen sodium)    Meloxicam 7.5 Mg Tabs (Meloxicam) .Marland Kitchen... 1 by mouth two times a day or as directed  Problem # 4:  HYPERTENSION (ICD-401.9) controlled  hasnt been seen for a year and due for labs  Her updated medication list for this problem  includes:    Diovan Hct 160-12.5 Mg Tabs (Valsartan-hydrochlorothiazide) .Marland Kitchen... 1 by mouth once daily  Orders: Specimen Handling (78469) TLB-BMP (Basic Metabolic Panel-BMET) (80048-METABOL) TLB-TSH (Thyroid Stimulating Hormone) (84443-TSH) TLB-Hepatic/Liver Function Pnl (80076-HEPATIC)  Problem # 5:  SLEEP APNEA (ICD-780.57)  Problem # 6:  HEADACHE (ICD-784.0) improved   Her updated medication list for this problem includes:    Aleve 220 Mg Tabs (Naproxen sodium)    Meloxicam 7.5 Mg Tabs (Meloxicam) .Marland Kitchen... 1 by mouth two times a day or as directed  Complete Medication List: 1)  Diovan Hct 160-12.5 Mg Tabs (Valsartan-hydrochlorothiazide) .Marland Kitchen.. 1 by mouth once daily 2)  Cpap 12 [facility], Also "nationwide"  3)  Klor-con M20 20 Meq Cr-tabs (Potassium chloride crys cr) .Marland Kitchen.. 1 by mouth once daily 4)  Topiramate 50 Mg Tabs (Topiramate) .... 1.5  by mouth two times a day or as directed 5)  Aleve 220 Mg Tabs (Naproxen sodium) 6)  Meloxicam 7.5  Mg Tabs (Meloxicam) .Marland Kitchen.. 1 by mouth two times a day or as directed  Other Orders: Admin 1st Vaccine (29562) Flu Vaccine 12yrs + (13086) TLB-Lipid Panel (80061-LIPID) TLB-A1C / Hgb A1C (Glycohemoglobin) (83036-A1C)  Patient Instructions: 1)  the numbness  may be from a pinched nerve or carpal tunnel. 2)  call if continuing and notice  position at your work station. 3)  Your heel pain is plantar fasciitis  4)  use  stretch and cold at night and gel inserts . 5)  i f persistent or  progressive  then see ortho or foot doctor . 6)  Losing weight will also help. 7)  Labs today and then plan follow up  depending on results . Prescriptions: DIOVAN HCT 160-12.5 MG  TABS (VALSARTAN-HYDROCHLOROTHIAZIDE) 1 by mouth once daily  #90 x 1   Entered and Authorized by:   Madelin Headings MD   Signed by:   Madelin Headings MD on 04/02/2010   Method used:   Electronically to        [facility]* (retail)             ,          Ph: [phone]       Fax:  [phone]   RxID:   [id number]    Orders Added: 1)  Admin 1st Vaccine [90471] 2)  Flu Vaccine 31yrs + [59563] 3)  Specimen Handling [99000] 4)  TLB-BMP (Basic Metabolic Panel-BMET) [80048-METABOL] 5)  TLB-TSH (Thyroid Stimulating Hormone) [84443-TSH] 6)  TLB-Hepatic/Liver Function Pnl [80076-HEPATIC] 7)  TLB-CBC Platelet - w/Differential [85025-CBCD] 8)  TLB-Lipid Panel [80061-LIPID] 9)  TLB-Magnesium (Mg) [83735-MG] 10)  TLB-A1C / Hgb A1C (Glycohemoglobin) [83036-A1C] 11)  Est. Patient Level IV [87564] Flu Vaccine Consent Questions     Do you have a history of severe allergic reactions to this vaccine? no    Any prior history of allergic reactions to egg and/or gelatin? no    Do you have a sensitivity to the preservative Thimersol? no    Do you have a past history of Guillan-Barre Syndrome? no    Do you currently have an acute febrile illness? no    Have you ever had a severe reaction to latex? no    Vaccine information given and explained to patient? yes    Are you currently pregnant? no    Lot Number:[id number]   Exp Date:12/15/2010   Site Given  Left Deltoid IM Romualdo Bolk, CMA (AAMA)  April 02, 2010 1:33 PM  1)  Admin 1st Vaccine [phone] 2)  Flu Vaccine 9yrs + [phone]       .lbflu

## 2010-07-19 NOTE — Progress Notes (Signed)
Summary: cpap prescription -- requesting to be completed today  Phone Note Other Incoming Call back at [phone] ext [phone]   Caller: james[facility] Summary of Call: Calling in ref to missing info on rx sent for cpap supplies on 12/1. Initial call taken by: Darletta Moll,  May 29, 2010 4:31 PM  Follow-up for Phone Call        Spoke with Fayrene Fearing.  He states that form was signed but the machine type and pressure range was missing.  Will print and have CDY answer the queestions and fax back.   Follow-up by: Vernie Murders,  May 29, 2010 4:42 PM  Additional Follow-up for Phone Call Additional follow up Details #1::        kevin w/ [facility] called re: same. he can be reached at same [phone] # but extention [phone]. Tivis Ringer, CNA  May 30, 2010 9:31 AM  Caryn Bee calling back with [facility] calling back about the status of form.  Requesting this to be completed today.  Form was printed and placed on CY's cart yesterday.  Will forward message to CY to addres. Additional Follow-up by: Gweneth Dimitri RN,  May 30, 2010 9:38 AM    Additional Follow-up for Phone Call Additional follow up Details #2::    I completed that form and i think it was to be faxed out today.  Follow-up by: Waymon Budge MD,  May 30, 2010 3:19 PM   Appended Document: cpap prescription -- requesting to be completed today form faxed by [facility] in triage.  called [facility], spoke with kevin and informed him of this.  kevin verbalized his understanding.

## 2010-07-19 NOTE — Letter (Signed)
Summary: CPAP Supplies/[facility]  CPAP Supplies/[facility]   Imported By: Sherian Rein 06/07/2010 12:28:07  _____________________________________________________________________  External Attachment:    Type:   Image     Comment:   External Document

## 2010-08-09 ENCOUNTER — Ambulatory Visit: Payer: Self-pay | Admitting: Internal Medicine

## 2010-09-06 ENCOUNTER — Ambulatory Visit (INDEPENDENT_AMBULATORY_CARE_PROVIDER_SITE_OTHER): Payer: Managed Care, Other (non HMO) | Admitting: Internal Medicine

## 2010-09-06 ENCOUNTER — Encounter: Payer: Self-pay | Admitting: Internal Medicine

## 2010-09-06 VITALS — BP 122/74 | HR 63 | Ht 68.5 in | Wt 242.6 lb

## 2010-09-06 DIAGNOSIS — R05 Cough: Secondary | ICD-10-CM

## 2010-09-06 DIAGNOSIS — R059 Cough, unspecified: Secondary | ICD-10-CM

## 2010-09-06 DIAGNOSIS — G4730 Sleep apnea, unspecified: Secondary | ICD-10-CM

## 2010-09-06 NOTE — Assessment & Plan Note (Addendum)
She remains comfortable with CPAP all night, most nights. Pressure is 11. She has trouble getting a comfortable mask. The plastic of her  full face mask seems too hard. We discussed possible use of a nasal mask with chin strap instead of her full face mask. We will see if the daytime sleep center staff can offer alternative mask suggestions, beyond what her [place] based DME is offering.

## 2010-09-06 NOTE — Progress Notes (Signed)
  Subjective:    Patient ID: LAGENA STRAND, female    DOB: 12/29/1950, 60 y.o.   MRN: [id number]  HPI 30 yoF last here 2/24/11for f/u of obstructive sleep apnea on CPAP. She has had cough in the past ,improved by removal of ACE inhibitor. She has  Been able to use CPAP most of the time,but is having trouble staying comfortable with her mask- currently a full face design. The plastic seems to get too hard. She didn't like nasal pillows. Pressure is 11.  Has had restless legs in past- not bothering much recently.  Usually she feels adequately rested.    Review of Systems    Constitutional:   No weight loss, night sweats,  Fevers, chills, fatigue, lassitude. HEENT:   No headaches,  Difficulty swallowing,  Tooth/dental problems,  Sore throat,                No sneezing, itching, ear ache, nasal congestion, post nasal drip,   CV:  No chest pain,  Orthopnea, PND, swelling in lower extremities, anasarca, dizziness, palpitations  GI  No heartburn, indigestion, abdominal pain, nausea, vomiting, diarrhea, change in bowel habits, loss of appetite  Resp: No shortness of breath with exertion or at rest.  No excess mucus, no productive cough,  No non-productive cough,  No coughing up of blood.  No change in color of mucus.  No wheezing.  No chest wall deformity  Skin: no rash or lesions.  GU: no dysuria, change in color of urine, no urgency or frequency.  No flank pain.  MS:  No joint pain or swelling.  No decreased range of motion.  No back pain.  Psych:  No change in mood or affect. No depression or anxiety.  No memory loss.  Objective:   Physical Exam    General- Alert, Oriented, Affect-appropriate, Distress- none acute  Skin- rash-none, lesions- none, excoriation- none  Lymphadenopathy- none  Head- atraumatic  Eyes- Gross vision intact, PERRLA, conjunctivae clear, secretions  Ears- Normal-  Hearing, canals, Tm L ,   R ,  Nose- Clear, no mucus, Septal dev, mucus, polyps, erosion,  perforation   Throat- Mallampati III , mucosa clear , drainage- none, tonsils- atrophic  Neck- flexible , trachea midline, no stridor , thyroid nl, carotid no bruit  Chest - symmetrical excursion , unlabored     Heart/CV- RRR , no murmur , no gallop  , no rub, nl s1 s2                     - JVD- none , edema- none, stasis changes- none, varices- none     Lung- clear to P&A, wheeze- none, cough- none , dullness-none, rub- none     Chest wall-  Abd- tender-no, distended-no, bowel sounds-present, HSM- no  Br/ Gen/ Rectal- Not done, not indicated  Extrem- cyanosis- none, clubbing, none, atrophy- none, strength- nl  Neuro- grossly intact to observation     Assessment & Plan:

## 2010-09-06 NOTE — Patient Instructions (Signed)
Order- We will [facility] daytime staff to meet with you for CPAP mask fitting.    We will leave the pressure at 11. Please call as needed.

## 2010-09-06 NOTE — Assessment & Plan Note (Signed)
Not coughing at all currently . We will intervene if needed.

## 2010-09-11 ENCOUNTER — Encounter: Payer: Self-pay | Admitting: Internal Medicine

## 2010-09-12 ENCOUNTER — Encounter: Payer: Self-pay | Admitting: Internal Medicine

## 2010-09-24 LAB — URINALYSIS, ROUTINE W REFLEX MICROSCOPIC
Bilirubin Urine: NEGATIVE
Hgb urine dipstick: NEGATIVE
Nitrite: NEGATIVE
Specific Gravity, Urine: 1.023 (ref 1.005–1.030)
Urobilinogen, UA: 0.2 mg/dL (ref 0.0–1.0)
pH: 6 (ref 5.0–8.0)

## 2010-09-24 LAB — BASIC METABOLIC PANEL
BUN: 15 mg/dL (ref 6–23)
Calcium: 9.3 mg/dL (ref 8.4–10.5)
Creatinine, Ser: 0.74 mg/dL (ref 0.4–1.2)
GFR calc Af Amer: >60 mL/min (ref >60)
GFR calc non Af Amer: >60 mL/min (ref >60)

## 2010-09-24 LAB — DIFFERENTIAL
Lymphs Abs: 2.3 10*3/uL (ref 0.7–4.0)
Monocytes Relative: 6 % (ref 3–12)
Neutro Abs: 3 10*3/uL (ref 1.7–7.7)
Neutrophils Relative %: 52 % (ref 43–77)

## 2010-09-24 LAB — CBC
Platelets: 203 10*3/uL (ref 150–400)
RBC: 4.35 MIL/uL (ref 3.87–5.11)
WBC: 5.8 10*3/uL (ref 4.0–10.5)

## 2010-10-30 NOTE — Op Note (Signed)
Elizabeth [name], Elizabeth [name]              ACCOUNT NO.:  [id number]   MEDICAL RECORD NO.:  [id number]          PATIENT TYPE:  AMB   LOCATION:  DAY                          FACILITY:  [facility]   PHYSICIAN:  Almedia Balls. Ranell Patrick, M.D. DATE OF BIRTH:  1950-09-22   DATE OF PROCEDURE:  11/25/2008  DATE OF DISCHARGE:                               OPERATIVE REPORT   PREOPERATIVE DIAGNOSES:  Right knee pain secondary to be medial meniscus  tear.   POSTOPERATIVE DIAGNOSES:  Right knee meniscus tear, lateral meniscus  tear, medial femoral condyle chondromalacia, as well as patellofemoral  chondromalacia, grade 4.   PROCEDURE PERFORMED:  Right knee arthroscopy with debridement of medial  and lateral meniscal, abrasion chondroplasty of patellofemoral joint and  abrasion chondroplasty medial femoral condyle.   ATTENDING SURGEON:  Dr. Malon Kindle.   ASSISTANT:  None.   ANESTHESIA:  General anesthesia via laryngeal mask.   ESTIMATED BLOOD LOSS:  Zero.   FLUID REPLACEMENT:  500 mL of crystalloid.   INSTRUMENT COUNTS:  Correct.   COMPLICATIONS:  None.   Preoperative antibiotics were given.   INDICATIONS:  The patient is a 58-year female with worsening right  medial knee pain.  The patient has had worsening pain despite  conservative management and has documented meniscal tears.  She presents  now for operative treatment having failed conservative management.   DESCRIPTION OF PROCEDURE PERFORMED:  After an adequate level of  anesthesia was achieved, the patient was positioned supine on the  operating room table.  After a sterile prep and drape of the knee, we  entered the knee using standard arthroscopic portals, including  superolateral outflow, anterolateral scope and anteromedial working  portals.  We identified significant patellofemoral chondromalacia,  mostly on the trochlear side, grade 3-4.  There was some patellar  chondromalacia.  There was quite a bit of marginal osteophytes around  the  patella.  I performed a chondroplasty down to bleeding bone in the  trochlea and removing all loose flaps of cartilage.  Medial and lateral  gutters were free of loose bodies.  No plica was noted.  The medial  meniscus was torn.  This was a posterior horn tear and an anterior horn  tear, both treated with debridement using basket forceps and motorized  shaver.  About 15-20% of the meniscus had to be removed.  The remainder  of the meniscus was in good shape and was left alone.  The medial  femoral condyle had an area about 1.5 x 1.5 cm partial thickness  chondral loss.  This was grade 3 and a little bit of grade 4.  I  performed a chondroplasty down to bleeding bone on this area to try  stimulate fibrocartilage healing.  The tibial cartilage appeared to be  in good shape with grade 1 and grade 2 changes with ACL and PCL intact.  The lateral compartment had a free edge lateral meniscus tear and  anterior horn meniscus tear treated with debrided using basket forceps  and motorized shaver.  Again, less than 15% of the meniscus had to be  removed.  Remaining meniscus appearing normal.  Articular cartilage  appeared much better in the lateral compartment, including minimal grade  1 changes, some softening and a little bit of fibrillation, but no real  tears.  Following completion of partial medial and lateral  meniscectomies and chondroplasty in the patellofemoral and medial  compartments, this concluded the surgery.  I sutured the wounds with 4-0  Monocryl followed by Steri-Strips and a sterile dressing.  The patient  tolerated the surgery well.      Almedia Balls. Ranell Patrick, M.D.  Electronically Signed     SRN/MEDQ  D:  11/25/2008  T:  11/25/2008  Job:  [id number]

## 2010-10-30 NOTE — Assessment & Plan Note (Signed)
[facility]                                 ON-CALL NOTE   NAME:[name], Elizabeth                       MRN:          [id number]  DATE:10/23/2007                            DOB:          [birth year]    Date of interaction Oct 23, 2007 at 5:42 p.m.  Phone number is [phone].   OBJECTIVE:  Medicine was not called in.  The patient had called  [facility] today and left a message that she was having ear pain.  Was  told by the nurse practitioner that she would check with the doctor and  call in a medication which was never called.  The patient has never  received a call back.  Checking the patient's chart, the patient was  supposed to be called and told to come to Saturday clinic, the message  of which she never received.  I told her I would see her tomorrow.  She  works at the [employer] and needs to work tomorrow and gets off at  noon.  I told her to come as soon as she gets off work, and I will see  her at [facility].  Primary care Calleen Alvis is Dr. Fabian Sharp; home office is  [facility].     Arta Silence, MD  Electronically Signed    RNS/MedQ  DD: 10/23/2007  DT: 10/23/2007  Job #: [id number]

## 2010-11-02 NOTE — Op Note (Signed)
Elizabeth [name], Elizabeth [name]              ACCOUNT NO.:  [id number]   MEDICAL RECORD NO.:  [id number]          PATIENT TYPE:  AMB   LOCATION:  SDC                           FACILITY:  [facility]   PHYSICIAN:   B. Earlene Plater, M.D.  DATE OF BIRTH:  [birth year]   DATE OF PROCEDURE:  05/06/2005  DATE OF DISCHARGE:                                 OPERATIVE REPORT   PREOPERATIVE DIAGNOSES:  1.  Abnormal uterine bleeding.  2.  Possible endometrial polyp.   POSTOPERATIVE DIAGNOSES:  1.  Abnormal uterine bleeding.  2.  Possible endometrial polyp.   OPERATION/PROCEDURE:  1.  Hysteroscopy.  2.  Dilatation and curettage.   SURGEON:  Chester Holstein. Earlene Plater, M.D.   ASSISTANT:  None.   ANESTHESIA:  LMA general.  10 mL 1% Nesacaine paracervical block.   SPECIMENS:  Endometrial curettings.   ESTIMATED BLOOD LOSS:  Minimal.   COMPLICATIONS:  None.   INDICATIONS:  Patient with history of irregular bleeding.  Ultrasound had  showed endometrium to be focally thickened.  Saline infusion ultrasonography  was suggestive of a focal endometrial mass.  The patient was advised the  risks of surgery including infection, bleeding, perforation, damage to  surrounding organs.   DESCRIPTION OF PROCEDURE:  The patient was taken to the operating room and  LMA general anesthesia was obtained.  She was prepped and draped in the  standard fashion.  Bladder emptied with in-and-out catheter.  The uterus was  examined.  Anteverted, upper limits of normal size.  No adnexal masses.   Speculum inserted and paracervical block placed.  The cervix was easily  dilated to #31.   The operative hysteroscope was inserted and the uterine cavity distended.  No focal mass was seen.  Both tubal ostia were visualized and the uterine  contour was noted to be slightly irregular towards the right cornua but no  focal mass was seen.  Therefore, the scope was removed and the endometrium  gently curetted and submitted to pathology.   Instruments  were removed and cervix was hemostatic.  She was taken to the  recovery room awake, alert and in stable condition.      Gerri Spore B. Earlene Plater, M.D.  Electronically Signed     WBD/MEDQ  D:  05/06/2005  T:  05/06/2005  Job:  [id number]

## 2010-11-02 NOTE — Assessment & Plan Note (Signed)
[facility]                                 ON-CALL NOTE   NAME:Elizabeth [name], Elizabeth [name]                       MRN:          [id number]  DATE:10/26/2007                            DOB:          04-Feb-1951    DATE OF INTERACTION:  Oct 26, 2007, at 8 a.m.   PHONE NUMBER:  [phone].   This is a note merely to document that the patient was scheduled an  appointment Saturday to be seen in the Saturday clinic at any time in  the afternoon until 1 o'clock.  By 1 o'clock the patient had never shown  up.  Primary care Kyrian Stage is Dr. Fabian Sharp.  Home office is [facility].     Arta Silence, MD  Electronically Signed    RNS/MedQ  DD: 10/26/2007  DT: 10/26/2007  Job #: [id number]

## 2010-11-02 NOTE — Op Note (Signed)
Elizabeth [name], Elizabeth [name]                          ACCOUNT NO.:  [id number]   MEDICAL RECORD NO.:  [id number]                   PATIENT TYPE:  OIB   LOCATION:  NA                                   FACILITY:  [facility]   PHYSICIAN:  Leonie Man, M.D.                DATE OF BIRTH:  1951-03-09   DATE OF PROCEDURE:  09/13/2002  DATE OF DISCHARGE:                                 OPERATIVE REPORT   PREOPERATIVE DIAGNOSES:  1. Anal polyp.  2. Anal tags.  3. Hemorrhoids, stage II.   POSTOPERATIVE DIAGNOSES:  1. Anal polyp.  2. Anal tags.  3. Hemorrhoids, stage II.   OPERATION/PROCEDURE:  1. Examination under anesthesia and proctosigmoidoscopy to 25 cm.  2. Excision of anal polyp.  3. Excision of anal tags.  4. Ligation of hemorrhoids.   ASSISTANT:  Nurse.   ANESTHESIA:  General.   INDICATIONS FOR PROCEDURE:  The patient is a 60 year old woman status post  colonoscopy who is noted to have a large pedunculated anal polyp  approximately 3 cm from the anal verge.  She also had been having some  problem with hygiene due to her anal tags.  Anoscopy showed some stage II  hemorrhoidal disease.  She comes to the operating room after the risks and  potential benefits as well as the indications for this procedure had been  fully discussed with her.  All questions answered and consent obtained.   DESCRIPTION OF PROCEDURE:  Following the induction of satisfactory general  anesthesia, the patient was positioned in the prone jackknife position and I  introduced the rigid sigmoidoscope into the anus.  I viewed the polyp at the  anal verge.  I carried the sigmoidoscope up to 25 cm.  No other mucosal  abnormalities were noted.  The sigmoidoscope was withdrawn.  The perianal  tissues were then prepped and draped to be included in the sterile operative  field.  The perianal tissue was then infiltrated with 0.5% Marcaine with  epinephrine, 1:200,000.  The operating anoscope was introduced.  The anal  polyp could be seen clearly in the polyp graspers and infiltrated at its  base with 0.5% Marcaine with epinephrine.  The base of the polyp was excised  along with the polyp removed and forwarded for pathologic evaluation using  electrocautery.  The mucosa was then suture ligated with 2-0 chromic catgut.  Subsequently, a very large anal tag in the interior midline was infiltrated  with Marcaine with epinephrine, excised in its entirety and the defect  closed with a running 2-0 chromic catgut.  Hemorrhoids at the 4 o'clock  position were infiltrated and suture ligated with 2-0 chromic catgut.  The  sponge, instruments and sharps counts  were verified.  The all areas of dissection were checked for hemostasis.  The wound surface was covered with Gelfoam soaked in Xylocaine with  epinephrine.  Sterile dressings were applied.  The anesthetic  was reversed  and the patient removed from the operating room to the recovery room in  stable condition.  She tolerated the procedure well.                                                 Leonie Man, M.D.    PB/MEDQ  D:  09/13/2002  T:  09/13/2002  Job:  [id number]

## 2010-11-02 NOTE — Op Note (Signed)
NAMEARLEE, BOSSARD              ACCOUNT NO.:  [id number]   MEDICAL RECORD NO.:  [id number]          PATIENT TYPE:  AMB   LOCATION:  DAY                          FACILITY:  [facility]   PHYSICIAN:  Adolph Pollack, M.D.DATE OF BIRTH:  1951/03/28   DATE OF PROCEDURE:  09/07/2004  DATE OF DISCHARGE:                                 OPERATIVE REPORT   PREOPERATIVE DIAGNOSIS:  Cholelithiasis, gallbladder neoplasm.   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   OPERATION/PROCEDURE:  Laparoscopic cholecystectomy with intraoperative  cholangiogram.   SURGEON:  Adolph Pollack, M.D.   ASSISTANT:  Leonie Man, M.D.   ANESTHESIA:  General.   INDICATIONS:  Mrs. Kube is a 60 year old female who had been having some  left lower abdominal wall pain. CT scan was performed which  demonstrate a  possible portion of the gallbladder with cholelithiasis.  Ultrasound  demonstrated cholelithiasis and also massive gallbladder which could be an  inferior stone but it could rule out a neoplasm.  For this reason, she is  brought to the operating room.  The procedure and the risks including  potential need for open procedure were explained to her.   DESCRIPTION OF PROCEDURE:  She was seen in the holding area and then brought  to the operating room, placed supine on the operating table and general  anesthetic was administered.  The abdominal wall was sterilely prepped and  draped. Dilute Marcaine solution was infiltrated in the supraumbilical  region and a small supraumbilical incision was made through the skin and  subcutaneous tissue.  Midline fascia was identified.  Small incision made  in.  A small incision was then made in the peritoneum.  The peritoneal  cavity was entered under direct vision.  A pursestring suture of 0 Vicryl  was placed around the fascial edges.  A Hasson trocar was introduced through  the peritoneal cavity.  Pneumoperitoneum was created by insufflation of CO2  gas.   Next, a laparoscope was introduced.  Under direct vision an 11 mm trocar was  placed in an epigastric incision and two 5 mm trocars were placed through  right mid abdomen.  She was placed in the reverse Trendelenburg position  with the right side tilted slightly upward.  The fundus of the gallbladder  was grasped and was relatively soft.  There were some chronic inflammatory  changes.  There were adhesions between the omentum of the gallbladder and  these were taken down using cautery and blunt dissection.  The fundus was  then retracted with the right shoulder.  The infundibulum was grasped and  retracted laterally.  I mobilized the infundibulum using blunt dissection  and intermittent cautery.  I then isolated the cystic artery, created a  window around it.  The cystic duct was identified, isolated and a window  created around it as well.  It was followed up to the gallbladder.  A clip  was placed just above the cystic duct-gallbladder junction.  A small  incision was made in the cystic duct and bile was milked back.  She did have  some elevated  liver function tests and was noted to have a fatty liver, but  I decided to perform a cholangiogram.   A cholangiocath was passed through the anterior abdominal wall and placed  into the cystic duct.  Under real time fluoroscopy, dilute contrast material  was injected into the cystic duct which was of moderate length.  The common  hepatic, right and left hepatic, and common bile ducts all filled promptly  and drained promptly.  The common bile duct may have been upper limits of  normal with respect to diameter but did not see any obvious evidence of  obstruction.  Final report is pending the radiologist's interpretation.   The cholangiocatheter was removed.  The cystic duct was then clipped three  times proximally and divided.  The cystic artery was clipped twice  proximally, once  distally and divided.  The gallbladder was dissected free  from the liver bed using electrocautery.  A very small puncture wound was  made in the gallbladder and a little bit of yellowish fluid was drained up,  a minimal amount.  The gallbladder was then removed and placed in an  endopouch bag.   The gallbladder fossa was copiously irrigated with 1 L of saline solution  and solution evacuated.  There were no evidence of bleeding or bile leak at  this time.  I then removed the gallbladder from the subumbilical port and  then closed the subumbilical fascial defect under laparoscopic vision by  tightening up and tying down the pursestring suture.  The remaining trocars  were then removed and pneumoperitoneum was released.  The skin incisions  were closed with 4-0 Monocryl subcuticular sutures followed by Steri-Strips  and sterile dressings. She tolerated the procedure well without any apparent  complications and was taken to the recovery room in satisfactory condition.      TJR/MEDQ  D:  09/07/2004  T:  09/07/2004  Job:  [id number]   cc:   Neta Mends. Fabian Sharp, M.D. [facility]

## 2010-11-02 NOTE — Procedures (Signed)
Elizabeth [name], Elizabeth [name]              ACCOUNT NO.:  [id number]   MEDICAL RECORD NO.:  [id number]          PATIENT TYPE:  OUT   LOCATION:  SLEEP CENTER                 FACILITY:  [facility]   PHYSICIAN:  Clinton D. Maple Hudson, M.D. DATE OF BIRTH:  1951-05-24   DATE OF STUDY:  08/21/2005                              NOCTURNAL POLYSOMNOGRAM   REFERRING PHYSICIAN:  Dr. Jetty Duhamel.   DATE OF STUDY:  Marcy 07, 2007.   INDICATION FOR STUDY:  Hypersomnia with sleep apnea. Epworth sleepiness  score 12/24, BMI 34, weight 219 pounds. Home medication: Blood pressure  medicine.Marland Kitchen   SLEEP ARCHITECTURE:  Total sleep time 317 minutes with sleep efficiency 77%.  Stage I was 7%, stage II 65%, stages III and IV 19%, REM 9% of total sleep  time. Sleep latency 12 minutes, REM latency 174 minutes, awake after sleep  onset 78 minutes, arousal index increased at 47 per hour indicating  fragmentation. No bedtime medication was reported.   RESPIRATORY DATA:  Split study protocol. Apnea-hypopnea index (AHI, RDI)  27.6 obstructive events per hour indicating moderate obstructive sleep  apnea/hypopnea syndrome before CPAP. This included 7 obstructive apneas and  55 hypopneas before CPAP. Events were not positional. REM AHI 18.6 per hour.  CPAP was titrated to 13 CWP, AHI 0 per hour. A petite ComfortGel mask with  heated humidifier was used. The technician added a chin strap at higher  pressures because of oral venting. A pressure of 11 CWP may be sufficient  for control (AHI 0 per hour).   OXYGEN DATA:  Mild to moderate snoring with oxygen desaturation to a nadir  of 80%. Mean oxygen saturation through the study was 95% on room air after  CPAP control.   CARDIAC DATA:  Normal sinus rhythm.   MOVEMENT/PARASOMNIA:  Occasional leg jerk with little effect on sleep.   IMPRESSION/RECOMMENDATIONS:  1.  Moderate obstructive sleep apnea/hypopnea syndrome, apnea-hypopnea index      27.6  per hour with nonpositional events,  mild to moderate snoring and      oxygen desaturation to 80%.  2.  Successful continuous positive airway pressure titration to a suggested      initial pressure of 11 CWP, apnea-hypopnea index 0 per hour. A petite      ComfortGel mask was used with heated humidifier.      Clinton D. Maple Hudson, M.D.  Diplomate, Biomedical engineer of Sleep Medicine  Electronically Signed     CDY/MEDQ  D:  08/25/2005 16:37:08  T:  08/26/2005 21:16:37  Job:  [id number]

## 2010-11-21 ENCOUNTER — Other Ambulatory Visit: Payer: Self-pay | Admitting: Internal Medicine

## 2010-11-21 NOTE — Telephone Encounter (Signed)
Pt needs to schedule a follow up appt before next refill. Letter sent to pt.

## 2011-09-06 ENCOUNTER — Ambulatory Visit: Payer: Managed Care, Other (non HMO) | Admitting: Internal Medicine

## 2011-09-25 ENCOUNTER — Other Ambulatory Visit: Payer: Self-pay | Admitting: Internal Medicine

## 2011-09-27 NOTE — Telephone Encounter (Signed)
Pt has not bee seen in a while.  Rx last filled 11/21/10 #90.  Pls advise.

## 2011-09-27 NOTE — Telephone Encounter (Signed)
She is over due for ov ( February)  And way overdue for labs ( 11 11)  Needs OV.   can do labs at that visit or before if she wishes Can refill only 30 until she is seen for visit.

## 2011-09-30 NOTE — Telephone Encounter (Signed)
Left a message for pt to return call to make an appt.  Rx sent to pharmacy.

## 2011-10-04 ENCOUNTER — Ambulatory Visit (INDEPENDENT_AMBULATORY_CARE_PROVIDER_SITE_OTHER): Payer: Managed Care, Other (non HMO) | Admitting: Internal Medicine

## 2011-10-04 ENCOUNTER — Encounter: Payer: Self-pay | Admitting: Internal Medicine

## 2011-10-04 VITALS — BP 130/84 | Temp 98.6°F | Wt 236.0 lb

## 2011-10-04 DIAGNOSIS — Z6835 Body mass index (BMI) 35.0-35.9, adult: Secondary | ICD-10-CM

## 2011-10-04 DIAGNOSIS — R7309 Other abnormal glucose: Secondary | ICD-10-CM

## 2011-10-04 DIAGNOSIS — R21 Rash and other nonspecific skin eruption: Secondary | ICD-10-CM

## 2011-10-04 DIAGNOSIS — G4730 Sleep apnea, unspecified: Secondary | ICD-10-CM

## 2011-10-04 DIAGNOSIS — G43909 Migraine, unspecified, not intractable, without status migrainosus: Secondary | ICD-10-CM

## 2011-10-04 DIAGNOSIS — I10 Essential (primary) hypertension: Secondary | ICD-10-CM

## 2011-10-04 LAB — CBC WITH DIFFERENTIAL/PLATELET
Basophils Relative: 0.3 % (ref 0.0–3.0)
Eosinophils Absolute: 0 10*3/uL (ref 0.0–0.7)
Eosinophils Relative: 0.5 % (ref 0.0–5.0)
Lymphocytes Relative: 36.4 % (ref 12.0–46.0)
MCHC: 33.9 g/dL (ref 30.0–36.0)
Neutrophils Relative %: 57.2 % (ref 43.0–77.0)
RBC: 4.43 Mil/uL (ref 3.87–5.11)
WBC: 6.5 10*3/uL (ref 4.5–10.5)

## 2011-10-04 LAB — HEPATIC FUNCTION PANEL
Alkaline Phosphatase: 71 U/L (ref 39–117)
Bilirubin, Direct: 0 mg/dL (ref 0.0–0.3)
Total Bilirubin: 0.6 mg/dL (ref 0.3–1.2)
Total Protein: 7.8 g/dL (ref 6.0–8.3)

## 2011-10-04 LAB — BASIC METABOLIC PANEL
Calcium: 9.5 mg/dL (ref 8.4–10.5)
Creatinine, Ser: 0.6 mg/dL (ref 0.4–1.2)

## 2011-10-04 LAB — LIPID PANEL
HDL: 80.1 mg/dL (ref >39.00)
Total CHOL/HDL Ratio: 3

## 2011-10-04 LAB — TSH: TSH: 1.38 u[IU]/mL (ref 0.35–5.50)

## 2011-10-04 LAB — LDL CHOLESTEROL, DIRECT: Direct LDL: 126.7 mg/dL

## 2011-10-04 MED ORDER — VALSARTAN-HYDROCHLOROTHIAZIDE 160-12.5 MG PO TABS: 1.0000 | ORAL_TABLET | Freq: Every day | ORAL | Status: AC

## 2011-10-04 NOTE — Progress Notes (Signed)
Subjective:    Patient ID: Elizabeth [name], female    DOB: Nov 18, 1950, 61 y.o.   MRN: [id number]  HPI Patient comes in to visit for SDA appointment for hypertension management. She has not been seen here in over a year. Since her last visit has done ok  Had knee surgery  . Treating sleep apnea taking bp meds  Not checking readings, Migraines  Not that often every other month off the topamax off an on. Lives near [place] and going to get PCP closer to home  .  No sig change in weight some exercise better since  Knee surgery . No numbness or DM sx  Has hx of  hperglycemia  Review of Systems ROS:  GEN/ HEENT: No fever, significant weight changes sweats vision problems hearing changes, CV/ PULM; No chest pain shortness of breath cough, syncope,edema  change in exercise tolerance. GI /GU: No adominal pain, vomiting, change in bowel habits. No blood in the stool. No significant GU symptoms. SKIN/HEME: ,no suspicious lesions or bleeding. No lymphadenopathy, nodules, masses.  Has rash  On chest and upper back ? If tinea  Helps area but keeps popping out .  Has cat at home  Onset weeks ago.  NEURO/ PSYCH:  No neurologic signs such as weakness numbness. No depression anxiety. IMM/ Allergy: No unusual infections.  Allergy .   Right shoulder  busitis. REST of 12 system review negative except as per HPI Left knee  Arthro  In December .  Past history family history social history reviewed in the electronic medical record. Outpatient Encounter Prescriptions as of 10/04/2011  Medication Sig Dispense Refill  . topiramate (TOPAMAX) 50 MG tablet 50 mg. Take 1.5 tablets by mouth two times a day or as directed       . valsartan-hydrochlorothiazide (DIOVAN-HCT) 160-12.5 MG per tablet Take 1 tablet by mouth daily.  90 tablet  2  . DISCONTD: meloxicam (MOBIC) 7.5 MG tablet Take 7.5 mg by mouth 2 (two) times daily.        Marland Kitchen DISCONTD: valsartan-hydrochlorothiazide (DIOVAN-HCT) 160-12.5 MG per tablet TAKE 1 TABLET  DAILY  30 tablet  0  . naproxen sodium (ANAPROX) 220 MG tablet Take 220 mg by mouth as needed.        Marland Kitchen DISCONTD: Cyanocobalamin (VITAMIN B 12 PO) Take 1 tablet by mouth daily.             Objective:   Physical Exam BP 130/84  Temp(Src) 98.6 F (37 C) (Oral)  Wt 236 lb (107.049 kg)  Wt Readings from Last 3 Encounters:  10/04/11 236 lb (107.049 kg)  09/06/10 242 lb 9.6 oz (110.043 kg)  04/02/10 235 lb (106.595 kg)  WDWN in nad HEENT at [place] perrla Neck: Supple without adenopathy or masses or bruits Chest:  Clear to A&P without wheezes rales or rhonchi CV:  S1-S2 no gallops or murmurs peripheral perfusion is normal Abdomen:  Sof,t normal bowel sounds without hepatosplenomegaly, no guarding rebound or masses no CVA tenderness No clubbing cyanosis or edema Neuro grossly non focal Skin:  Faded smal ovoid patches  Faded on back mostly trunk and proximal  No scaling  No herald patch     Assessment & Plan:  Hypertension Apparently controlled  . Refill meds and labs today  Over due for  laboratory studies ( last check  a year and a half.) OSA.  Under rx  HAs.  reviewed use of meds  Hx of hyperglycemia Obesity  Rash   ? If  tinea partly rx vs something like  PR .  Disc  Care and fu if needed Disc transferring care .  Sign for records to be sent when needed. Will get her copy of labs when available.

## 2011-10-04 NOTE — Patient Instructions (Signed)
Will notify you  of labs when available. Continue meds for now.   Can use antifungal for rash and  Check pet if not better  Recheck .   Contact us if  Needed  Before you   Transfer care.

## 2011-10-05 ENCOUNTER — Encounter: Payer: Self-pay | Admitting: Internal Medicine

## 2011-10-05 DIAGNOSIS — R21 Rash and other nonspecific skin eruption: Secondary | ICD-10-CM | POA: Insufficient documentation

## 2011-10-05 DIAGNOSIS — Z6835 Body mass index (BMI) 35.0-35.9, adult: Secondary | ICD-10-CM | POA: Insufficient documentation

## 2012-10-15 ENCOUNTER — Encounter: Payer: Self-pay | Admitting: Gastroenterology

## 2014-08-31 ENCOUNTER — Encounter: Payer: Self-pay | Admitting: Gastroenterology

## 2014-11-04 ENCOUNTER — Encounter: Payer: Self-pay | Admitting: Gastroenterology

## 2020-02-21 ENCOUNTER — Encounter (HOSPITAL_BASED_OUTPATIENT_CLINIC_OR_DEPARTMENT_OTHER): Payer: Self-pay

## 2020-02-21 ENCOUNTER — Other Ambulatory Visit: Payer: Self-pay

## 2020-02-21 ENCOUNTER — Emergency Department (HOSPITAL_BASED_OUTPATIENT_CLINIC_OR_DEPARTMENT_OTHER)
Admission: EM | Admit: 2020-02-21 | Discharge: 2020-02-22 | Disposition: A | Discharge status: Home / Self Care | Payer: Medicare HMO | Attending: Emergency Medicine | Admitting: Emergency Medicine

## 2020-02-21 DIAGNOSIS — Y92410 Unspecified street and highway as the place of occurrence of the external cause: Secondary | ICD-10-CM | POA: Diagnosis not present

## 2020-02-21 DIAGNOSIS — Z8541 Personal history of malignant neoplasm of cervix uteri: Secondary | ICD-10-CM | POA: Insufficient documentation

## 2020-02-21 DIAGNOSIS — Y999 Unspecified external cause status: Secondary | ICD-10-CM | POA: Diagnosis not present

## 2020-02-21 DIAGNOSIS — M25612 Stiffness of left shoulder, not elsewhere classified: Secondary | ICD-10-CM | POA: Insufficient documentation

## 2020-02-21 DIAGNOSIS — M25662 Stiffness of left knee, not elsewhere classified: Secondary | ICD-10-CM | POA: Diagnosis not present

## 2020-02-21 DIAGNOSIS — Y9389 Activity, other specified: Secondary | ICD-10-CM | POA: Diagnosis not present

## 2020-02-21 DIAGNOSIS — M25661 Stiffness of right knee, not elsewhere classified: Secondary | ICD-10-CM | POA: Diagnosis not present

## 2020-02-21 DIAGNOSIS — Z041 Encounter for examination and observation following transport accident: Secondary | ICD-10-CM | POA: Diagnosis not present

## 2020-02-21 DIAGNOSIS — I10 Essential (primary) hypertension: Secondary | ICD-10-CM | POA: Diagnosis not present

## 2020-02-21 DIAGNOSIS — M25611 Stiffness of right shoulder, not elsewhere classified: Secondary | ICD-10-CM | POA: Diagnosis present

## 2020-02-21 HISTORY — DX: Malignant neoplasm of cervix uteri, unspecified: C53.9

## 2020-02-21 MED ORDER — DICLOFENAC SODIUM 1 % EX GEL: 2.0000 g | Freq: Four times a day (QID) | CUTANEOUS | 0 refills | Status: AC | PRN

## 2020-02-21 NOTE — ED Notes (Signed)
ED Provider at bedside. 
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
## 2020-11-15 DEATH — deceased
